# Patient Record
Sex: Female | Born: 1985 | Hispanic: No | Marital: Single
Health system: Southern US, Community
[De-identification: ages and names within clinical notes are randomized; demographics above are authoritative.]

---

## 2008-06-04 ENCOUNTER — Other Ambulatory Visit: Admission: RE | Admit: 2008-06-04 | Discharge: 2008-06-04 | Payer: Self-pay | Admitting: Gynecology

## 2017-07-18 ENCOUNTER — Emergency Department
Admission: EM | Admit: 2017-07-18 | Discharge: 2017-07-18 | Disposition: A | Discharge status: Home / Self Care | Payer: Self-pay | Attending: Emergency Medicine | Admitting: Emergency Medicine

## 2017-07-18 ENCOUNTER — Emergency Department: Payer: Self-pay

## 2017-07-18 ENCOUNTER — Other Ambulatory Visit: Payer: Self-pay

## 2017-07-18 ENCOUNTER — Encounter: Payer: Self-pay | Admitting: Emergency Medicine

## 2017-07-18 DIAGNOSIS — F419 Anxiety disorder, unspecified: Secondary | ICD-10-CM | POA: Insufficient documentation

## 2017-07-18 LAB — BASIC METABOLIC PANEL
ANION GAP: 4 — AB (ref 5–15)
BUN: 9 mg/dL (ref 6–20)
CALCIUM: 8.9 mg/dL (ref 8.9–10.3)
CO2: 27 mmol/L (ref 22–32)
Chloride: 104 mmol/L (ref 101–111)
Creatinine, Ser: 0.72 mg/dL (ref 0.44–1.00)
GFR calc Af Amer: >60 mL/min (ref >60)
Glucose, Bld: 100 mg/dL — ABNORMAL HIGH (ref 65–99)
POTASSIUM: 4 mmol/L (ref 3.5–5.1)
SODIUM: 135 mmol/L (ref 135–145)

## 2017-07-18 LAB — CBC
HEMATOCRIT: 44.3 % (ref 35.0–47.0)
Hemoglobin: 15 g/dL (ref 12.0–16.0)
MCH: 30.1 pg (ref 26.0–34.0)
MCHC: 33.7 g/dL (ref 32.0–36.0)
MCV: 89.3 fL (ref 80.0–100.0)
Platelets: 255 10*3/uL (ref 150–440)
RBC: 4.96 MIL/uL (ref 3.80–5.20)
RDW: 12.9 % (ref 11.5–14.5)
WBC: 7 10*3/uL (ref 3.6–11.0)

## 2017-07-18 LAB — TROPONIN I

## 2017-07-18 MED ORDER — DIAZEPAM 5 MG PO TABS
10.0000 mg | ORAL_TABLET | Freq: Once | ORAL | Status: AC
  Administered 2017-07-18: 10 mg via ORAL
  Filled 2017-07-18: qty 2

## 2017-07-18 MED ORDER — LORAZEPAM 1 MG PO TABS: 1.0000 mg | ORAL_TABLET | Freq: Three times a day (TID) | ORAL | 0 refills | Status: AC | PRN

## 2017-07-18 NOTE — ED Provider Notes (Signed)
[facility] Emergency Department Provider Note       Time seen: ----------------------------------------- 11:20 AM on 07/18/2017 -----------------------------------------   I have reviewed the triage vital signs and the nursing notes.  HISTORY   Chief Complaint Panic Attack    HPI Sonya Oneal is a 31 y.o. female with no significant past medical history who presents to the ED for anxiety.  Patient reports she has been having panic attacks for 3 weeks.  Patient states she had an EKG done and she was told this was likely a panic attack.  For the past 5 days she has been having anxiety almost constantly.  She states she is tearful and cannot sleep well at night.  She is also had some chest pain and left arm pain.  She denies any risk factors for heart disease.  History reviewed. No pertinent past medical history.  There are no active problems to display for this patient.   History reviewed. No pertinent surgical history.  Allergies Patient has no known allergies.  Social History Social History   Tobacco Use  . Smoking status: Not on file  Substance Use Topics  . Alcohol use: Yes    Alcohol/week: 0.6 oz    Types: 1 Glasses of wine per week  . Drug use: No    Review of Systems Constitutional: Negative for fever. ENT:  Negative for congestion, sore throat Cardiovascular: Positive for chest pain Respiratory: Negative for shortness of breath. Gastrointestinal: Negative for abdominal pain, vomiting and diarrhea. Genitourinary: Negative for dysuria. Musculoskeletal: Negative for back pain. Skin: Negative for rash. Neurological: Negative for headaches, focal weakness or numbness. Psychiatric: Positive for anxiety, insomnia  All systems negative/normal/unremarkable except as stated in the HPI  ____________________________________________   PHYSICAL EXAM:  VITAL SIGNS: ED Triage Vitals  Enc Vitals Group     BP 07/18/17 1048 132/71   Pulse Rate 07/18/17 1048 97     Resp 07/18/17 1048 16     Temp 07/18/17 1048 98.9 F (37.2 C)     Temp Source 07/18/17 1048 Oral     SpO2 07/18/17 1048 97 %     Weight 07/18/17 1044 218 lb (98.9 kg)     Height 07/18/17 1044 5\' 7"  (1.702 m)     Head Circumference --      Peak Flow --      Pain Score 07/18/17 1044 2     Pain Loc --      Pain Edu? --      Excl. in GC? --     Constitutional: Alert and oriented.  Anxious, no distress Eyes: Conjunctivae are normal. Normal extraocular movements. ENT   Head: Normocephalic and atraumatic.   Nose: No congestion/rhinnorhea.   Mouth/Throat: Mucous membranes are moist.   Neck: No stridor. Cardiovascular: Normal rate, regular rhythm. No murmurs, rubs, or gallops. Respiratory: Normal respiratory effort without tachypnea nor retractions. Breath sounds are clear and equal bilaterally. No wheezes/rales/rhonchi. Gastrointestinal: Soft and nontender. Normal bowel sounds Musculoskeletal: Nontender with normal range of motion in extremities. No lower extremity tenderness nor edema. Neurologic:  Normal speech and language. No gross focal neurologic deficits are appreciated.  Skin:  Skin is warm, dry and intact. No rash noted. Psychiatric: Anxious and tearful, no distress ____________________________________________  EKG: Interpreted by me.  Sinus rhythm rate of 94 bpm, normal PR interval, normal QRS, normal QT.  ____________________________________________  ED COURSE:  As part of my medical decision making, I reviewed the following data within the electronic MEDICAL RECORD NUMBER  History obtained from family if available, nursing notes, old chart and ekg, as well as notes from prior ED visits. Patient presented for chest pain likely panic attacks, we will assess with labs and imaging as indicated at this time.   Procedures ____________________________________________   LABS (pertinent positives/negatives)  Labs Reviewed  BASIC  METABOLIC PANEL - Abnormal; Notable for the following components:      Result Value   Glucose, Bld 100 (*)    Anion gap 4 (*)    All other components within normal limits  CBC  TROPONIN I  ____________________________________________  DIFFERENTIAL DIAGNOSIS   Anxiety, panic attack, PE, pneumothorax, unstable angina  FINAL ASSESSMENT AND PLAN  Anxiety   Plan: Patient had presented for symptoms of anxiety and panic attack. Patient's labs were unremarkable.  He is low risk for ACS according to heart score.  She is feeling better after Valium.  I will prescribe Ativan to take as needed.  She is stable for outpatient follow-up.   Emily FilbertWilliams, Jonathan E, MD   Note: This note was generated in part or whole with voice recognition software. Voice recognition is usually quite accurate but there are transcription errors that can and very often do occur. I apologize for any typographical errors that were not detected and corrected.     Emily FilbertWilliams, Jonathan E, MD 07/18/17 [phone]

## 2017-07-18 NOTE — ED Notes (Signed)
Left arm and chest discomfort for approx 1 week. Pt reports she was told it was anxiety. Pt not sleeping well at night. Pt tearful. EDP at bedside.

## 2017-07-18 NOTE — ED Triage Notes (Signed)
Pt reports that she has had panic attacks for three weeks, she has had a EKG and they told her it was panic attacks, for the last 5 days she has been having constant panic attacks. Pt is tearful. She states that her arms are shaky, her chest and left arm hurt.

## 2018-09-27 IMAGING — CR DG CHEST 2V
1 series · 2 of 2 positions shown · non-contrast
Comparison: none

CLINICAL DATA: panic attacks for three weeks, she has had a EKG and
they told her it was panic attacks, for the last 5 days she has been
having constant panic attacks. Pt is tearful. She states that her
arms are shaky, her chest and left arm hurt, nonsmoker,

EXAM:
CHEST - 2 VIEW

[Series 1: dg chest 2 view · 0.14mm/px · 2 of 2 slices shown]
[im 1/2]
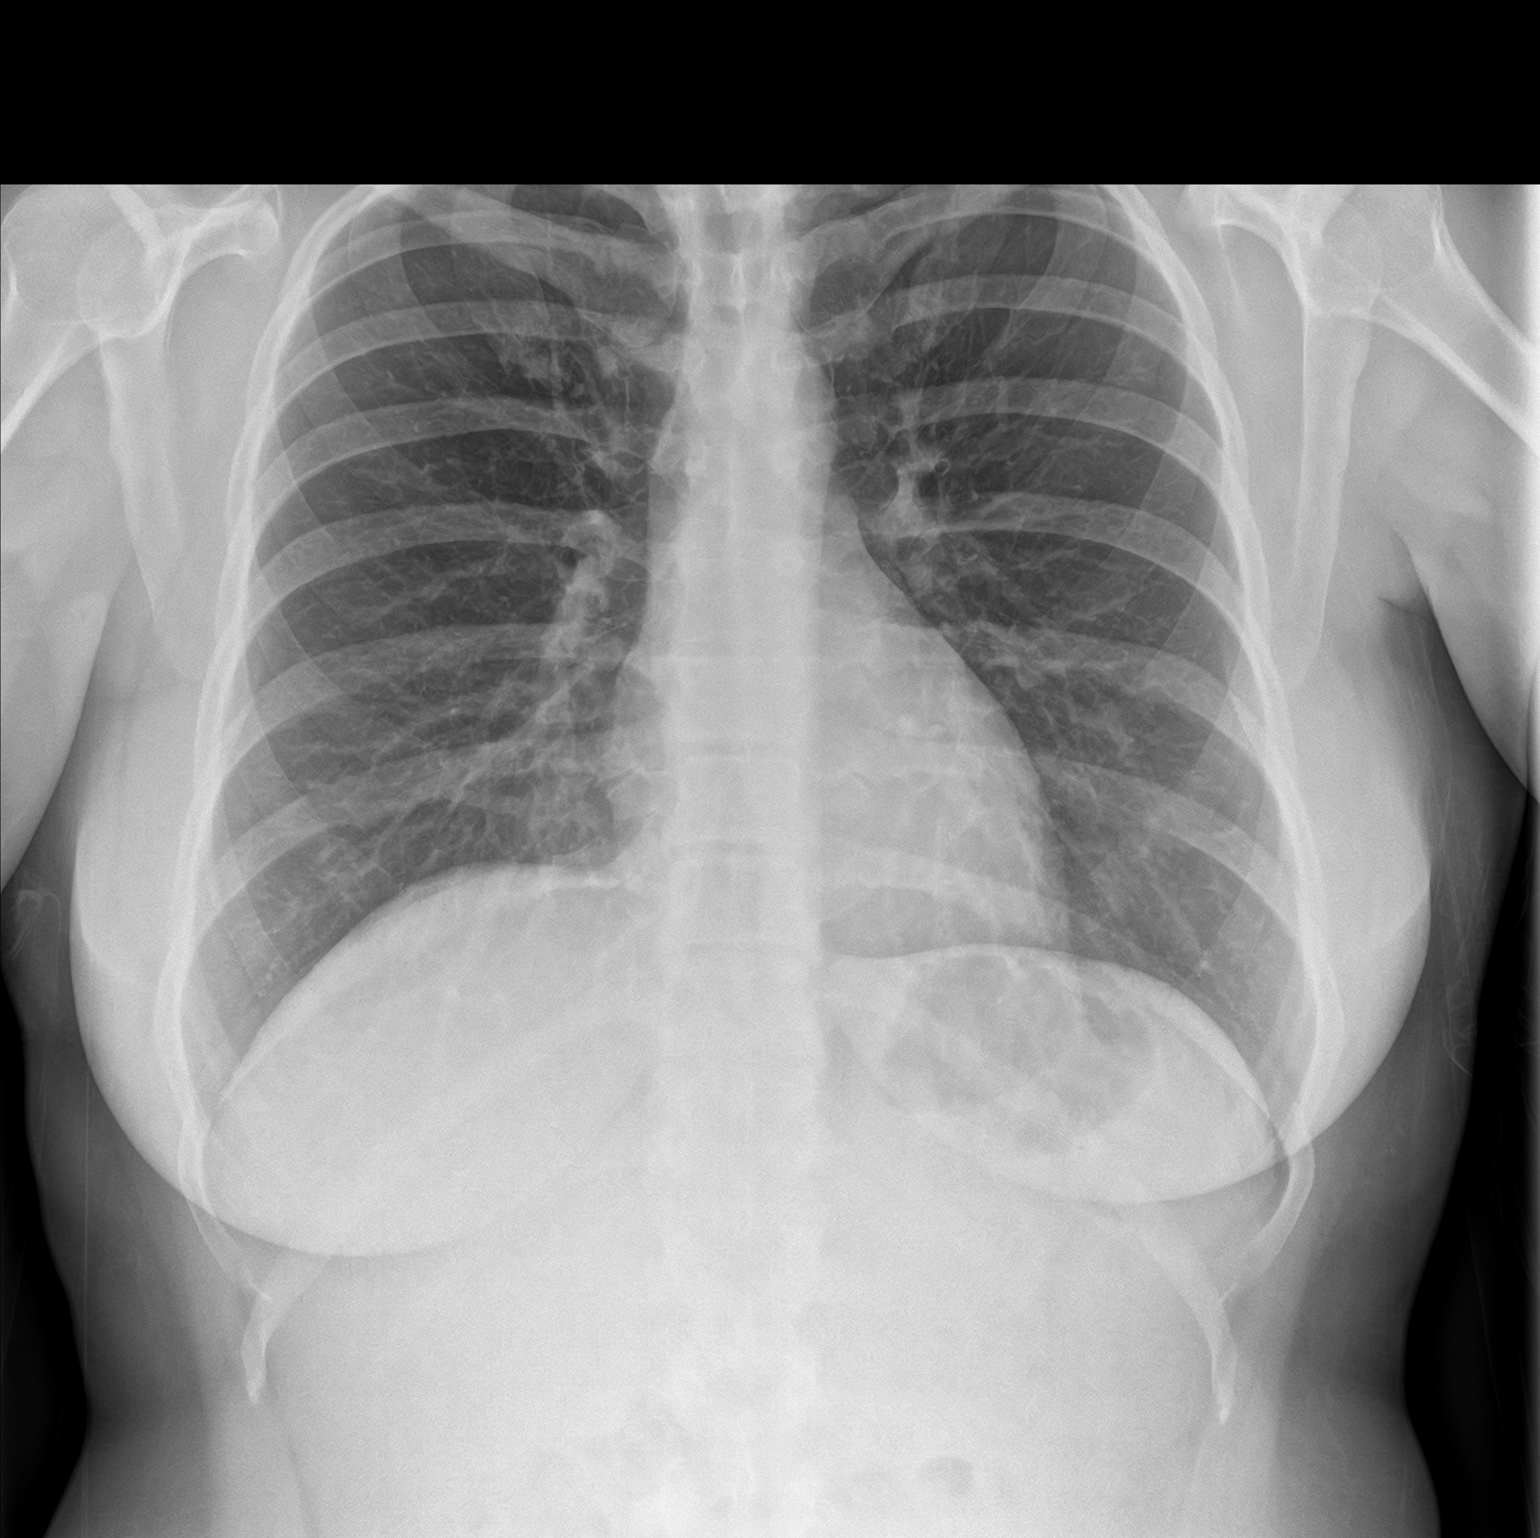
[im 2/2]
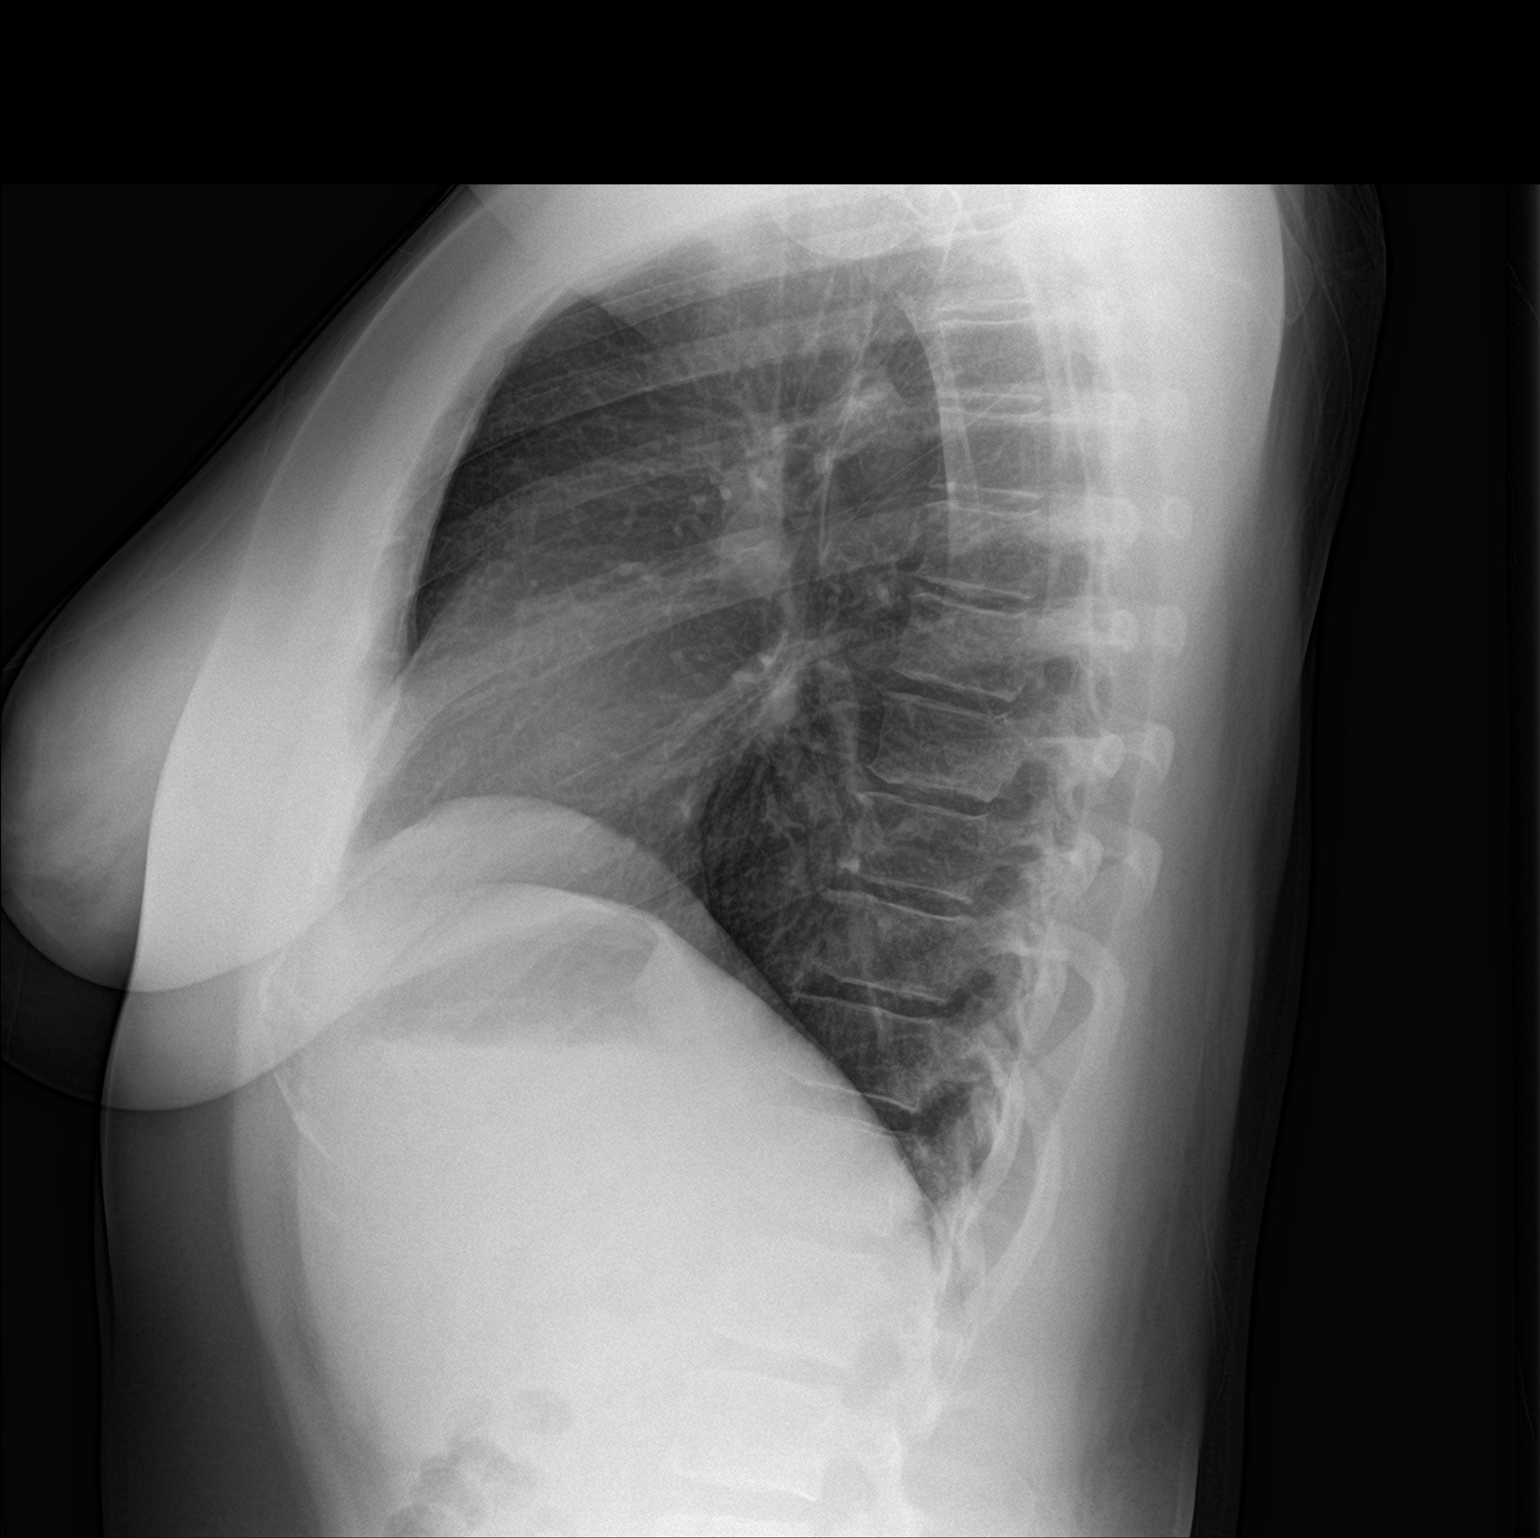

[2 of 2 positions shown; findings below may reference images not displayed]

FINDINGS: Lungs are clear.

Heart size and mediastinal contours are within normal limits.

No effusion.  No pneumothorax.

Visualized bones unremarkable.
IMPRESSION: No acute cardiopulmonary disease.
